# Patient Record
Sex: Female | Born: 1980 | Race: White | Hispanic: No | Marital: Married | Smoking: Current every day smoker
Health system: Southern US, Community
[De-identification: ages and names within clinical notes are randomized; demographics above are authoritative.]

## PROBLEM LIST (undated history)

## (undated) DIAGNOSIS — G43909 Migraine, unspecified, not intractable, without status migrainosus: Secondary | ICD-10-CM

## (undated) DIAGNOSIS — E0500 Thyrotoxicosis with diffuse goiter without thyrotoxic crisis or storm: Secondary | ICD-10-CM

## (undated) DIAGNOSIS — B20 Human immunodeficiency virus [HIV] disease: Secondary | ICD-10-CM

## (undated) DIAGNOSIS — M358 Other specified systemic involvement of connective tissue: Secondary | ICD-10-CM

## (undated) HISTORY — PX: DILATION AND CURETTAGE OF UTERUS: SHX78

## (undated) HISTORY — PX: THYROIDECTOMY: SHX17

---

## 2004-02-26 ENCOUNTER — Emergency Department (HOSPITAL_COMMUNITY): Admission: EM | Admit: 2004-02-26 | Discharge: 2004-02-26 | Payer: Self-pay | Admitting: Emergency Medicine

## 2006-04-24 ENCOUNTER — Encounter: Admission: RE | Admit: 2006-04-24 | Discharge: 2006-05-07 | Payer: Self-pay | Admitting: Family Medicine

## 2007-01-01 ENCOUNTER — Other Ambulatory Visit: Admission: RE | Admit: 2007-01-01 | Discharge: 2007-01-01 | Payer: Self-pay | Admitting: Obstetrics and Gynecology

## 2007-03-25 ENCOUNTER — Ambulatory Visit (HOSPITAL_COMMUNITY): Admission: RE | Admit: 2007-03-25 | Discharge: 2007-03-25 | Payer: Self-pay | Admitting: Obstetrics and Gynecology

## 2007-05-02 ENCOUNTER — Other Ambulatory Visit: Payer: Self-pay

## 2007-05-02 ENCOUNTER — Inpatient Hospital Stay (HOSPITAL_COMMUNITY): Admission: AD | Admit: 2007-05-02 | Discharge: 2007-05-02 | Payer: Self-pay | Admitting: Obstetrics and Gynecology

## 2007-05-02 ENCOUNTER — Other Ambulatory Visit: Payer: Self-pay | Admitting: Emergency Medicine

## 2007-06-27 ENCOUNTER — Inpatient Hospital Stay (HOSPITAL_COMMUNITY): Admission: AD | Admit: 2007-06-27 | Discharge: 2007-06-30 | Payer: Self-pay | Admitting: Obstetrics and Gynecology

## 2007-06-27 ENCOUNTER — Encounter (INDEPENDENT_AMBULATORY_CARE_PROVIDER_SITE_OTHER): Payer: Self-pay | Admitting: Obstetrics and Gynecology

## 2007-09-03 ENCOUNTER — Emergency Department (HOSPITAL_COMMUNITY): Admission: EM | Admit: 2007-09-03 | Discharge: 2007-09-03 | Payer: Self-pay | Admitting: Emergency Medicine

## 2007-11-13 ENCOUNTER — Inpatient Hospital Stay (HOSPITAL_COMMUNITY): Admission: AD | Admit: 2007-11-13 | Discharge: 2007-11-14 | Payer: Self-pay | Admitting: Obstetrics and Gynecology

## 2007-11-16 ENCOUNTER — Inpatient Hospital Stay (HOSPITAL_COMMUNITY): Admission: AD | Admit: 2007-11-16 | Discharge: 2007-11-16 | Payer: Self-pay | Admitting: Family Medicine

## 2007-11-18 ENCOUNTER — Inpatient Hospital Stay (HOSPITAL_COMMUNITY): Admission: AD | Admit: 2007-11-18 | Discharge: 2007-11-18 | Payer: Self-pay | Admitting: Obstetrics and Gynecology

## 2007-11-25 ENCOUNTER — Inpatient Hospital Stay (HOSPITAL_COMMUNITY): Admission: RE | Admit: 2007-11-25 | Discharge: 2007-11-25 | Payer: Self-pay | Admitting: Obstetrics and Gynecology

## 2007-12-02 ENCOUNTER — Inpatient Hospital Stay (HOSPITAL_COMMUNITY): Admission: RE | Admit: 2007-12-02 | Discharge: 2007-12-02 | Payer: Self-pay | Admitting: Obstetrics & Gynecology

## 2009-03-19 ENCOUNTER — Encounter: Admission: RE | Admit: 2009-03-19 | Discharge: 2009-03-19 | Payer: Self-pay | Admitting: Family Medicine

## 2010-12-20 NOTE — Op Note (Signed)
NAMEMARKALA, Desiree Branch            ACCOUNT NO.:  [id number]   MEDICAL RECORD NO.:  [id number]          PATIENT TYPE:  INP   LOCATION:  9304                          FACILITY:  [facility]   PHYSICIAN:  James A. Ashley Royalty, M.D.DATE OF BIRTH:  [birth year]   DATE OF PROCEDURE:  06/27/2007  DATE OF DISCHARGE:                               OPERATIVE REPORT   PREOPERATIVE DIAGNOSES:  1. Intrauterine intrauterine pregnancy at 32 weeks 5 days' gestation.  2. Abruptio placenta.   POSTOPERATIVE DIAGNOSES:  1. Intrauterine intrauterine pregnancy at 32 weeks 5 days' gestation.  2. Abruptio placenta.   PROCEDURE:  Primary low transverse cesarean section.   SURGEON:  Sylvester Harder, MD   ANESTHESIA:  Spinal.   ESTIMATED BLOOD LOSS:  800 mL.   COMPLICATIONS:  None.   PACKS AND DRAINS:  Foley.   FINDINGS:  A 1795 g (3-pound 15-ounce) female, Apgars 5 at 1 minute and 8  at 5 minutes, sent to the newborn nursery.  Arterial cord pH 7.19.   PROCEDURE:  The patient was taken to the operating room and placed in  the sitting position.  After a spinal anesthetic was administered, she  was placed in the dorsal supine and prepped and draped in the usual  manner for abdominal surgery.  Foley catheter was in place.  A  Pfannenstiel incision was made down to level of the fascia, which was  nicked with a knife and incised transversely with Mayo scissors.  The  underlying rectus muscles were separated from the fascia using sharp and  blunt dissection.  The rectus muscles were separated in the midline,  exposing the peritoneum, which was elevated with hemostats and entered  atraumatically with Metzenbaum scissors.  The incision was extended  longitudinally.  The uterus was identified and a bladder flap created by  incising the anterior uterine serosa and sharply and bluntly dissecting  the bladder inferiorly.  It was held in place with a bladder blade.  The  uterus was then entered through a low transverse  incision using sharp  and blunt dissection.  The fluid was noted be clear.  The infant was  delivered from a vertex presentation in an atraumatic manner.  The  infant was suctioned.  The cord was triply clamped, cut and the infant  given immediately to the awaiting pediatrics team.  Arterial cord pH was  obtained from an isolated segment.  Next, regular cord blood was  obtained.  The placenta and membranes were removed in their entirety and  submitted to Pathology for histologic studies.  The uterus was  exteriorized.   The uterus was then closed in 2 running layers with #1 Vicryl; the first  was a running-locking layer; the second was a running, intermittently  locking, and imbricating layer.  Hemostasis was noted.  Uterus, tubes  and ovaries were inspected and found to be normal.  There were returned  to the abdominal cavity.  Copious irrigation was accomplished.  Hemostasis was noted.  The peritoneum was then closed with 3-0 Vicryl in  a running fashion.  The fascia was closed with 0 Vicryl in a running  fashion.  The skin was closed with staples.   The patient tolerated the procedure extremely well and was returned to  the recovery room in good condition.      James A. Ashley Royalty, M.D.  Electronically Signed     JAM/MEDQ  D:  06/27/2007  T:  06/28/2007  Job:  [id number]
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
## 2011-05-02 LAB — WET PREP, GENITAL
Clue Cells Wet Prep HPF POC: NONE SEEN
Trich, Wet Prep: NONE SEEN
Yeast Wet Prep HPF POC: NONE SEEN

## 2011-05-02 LAB — URINALYSIS, ROUTINE W REFLEX MICROSCOPIC
Ketones, ur: NEGATIVE
Nitrite: NEGATIVE
Protein, ur: NEGATIVE
Specific Gravity, Urine: <1.005 — ABNORMAL LOW
Urobilinogen, UA: 0.2
pH: 5.5

## 2011-05-02 LAB — HCG, QUANTITATIVE, PREGNANCY
hCG, Beta Chain, Quant, S: 1362 — ABNORMAL HIGH
hCG, Beta Chain, Quant, S: 399 — ABNORMAL HIGH

## 2011-05-02 LAB — GC/CHLAMYDIA PROBE AMP, GENITAL: Chlamydia, DNA Probe: NEGATIVE

## 2011-05-02 LAB — CBC
Platelets: 214
RDW: 16.8 — ABNORMAL HIGH

## 2011-05-16 LAB — DIC (DISSEMINATED INTRAVASCULAR COAGULATION)PANEL
D-Dimer, Quant: 4.63 — ABNORMAL HIGH
Fibrinogen: 335
INR: 0.9
Prothrombin Time: 12.5
Smear Review: NONE SEEN

## 2011-05-16 LAB — CBC
MCHC: 34.8
MCHC: 35.3
MCV: 92.2
Platelets: 160
Platelets: 185
RBC: 2.97 — ABNORMAL LOW
RDW: 13.1
RDW: 13.1
RDW: 13.2
WBC: 12.8 — ABNORMAL HIGH

## 2011-05-16 LAB — ABO/RH: ABO/RH(D): A POS

## 2011-05-16 LAB — TYPE AND SCREEN: ABO/RH(D): A POS

## 2011-05-18 LAB — CBC
Hemoglobin: 12.7
MCHC: 34.5
Platelets: 209
RBC: 4
RDW: 13.6

## 2011-05-18 LAB — URINALYSIS, ROUTINE W REFLEX MICROSCOPIC
Hgb urine dipstick: NEGATIVE
Ketones, ur: NEGATIVE
Nitrite: NEGATIVE
pH: 7.5

## 2012-01-03 ENCOUNTER — Emergency Department
Admission: EM | Admit: 2012-01-03 | Discharge: 2012-01-03 | Disposition: A | Discharge status: Home / Self Care | Payer: BC Managed Care – PPO | Source: Home / Self Care | Attending: Family Medicine | Admitting: Family Medicine

## 2012-01-03 ENCOUNTER — Emergency Department
Admit: 2012-01-03 | Discharge: 2012-01-03 | Disposition: A | Discharge status: Home / Self Care | Payer: BC Managed Care – PPO

## 2012-01-03 ENCOUNTER — Other Ambulatory Visit: Payer: Self-pay | Admitting: Family Medicine

## 2012-01-03 DIAGNOSIS — J209 Acute bronchitis, unspecified: Secondary | ICD-10-CM

## 2012-01-03 DIAGNOSIS — H6590 Unspecified nonsuppurative otitis media, unspecified ear: Secondary | ICD-10-CM

## 2012-01-03 DIAGNOSIS — H6592 Unspecified nonsuppurative otitis media, left ear: Secondary | ICD-10-CM

## 2012-01-03 HISTORY — DX: Migraine, unspecified, not intractable, without status migrainosus: G43.909

## 2012-01-03 HISTORY — DX: Thyrotoxicosis with diffuse goiter without thyrotoxic crisis or storm: E05.00

## 2012-01-03 MED ORDER — CLARITHROMYCIN 500 MG PO TABS: 500.0000 mg | ORAL_TABLET | Freq: Two times a day (BID) | ORAL | Status: AC

## 2012-01-03 MED ORDER — HYDROCOD POLST-CHLORPHEN POLST 10-8 MG/5ML PO LQCR: 5.0000 mL | Freq: Every evening | ORAL | Status: DC | PRN

## 2012-01-03 MED ORDER — PSEUDOEPHEDRINE-GUAIFENESIN ER 120-1200 MG PO TB12: ORAL_TABLET | ORAL | Status: DC

## 2012-01-03 MED ORDER — PREDNISONE 20 MG PO TABS: 20.0000 mg | ORAL_TABLET | Freq: Two times a day (BID) | ORAL | Status: AC

## 2012-01-03 NOTE — ED Provider Notes (Signed)
History     CSN: [id number]  Arrival date & time 01/03/12  1010   First MD Initiated Contact with Patient 01/03/12 1030      Chief Complaint  Patient presents with  . Cough    productive x 1 week     HPI Comments: Patient complains of approximately 6 day history of a persistent cough, without initial sore throat and sinus congestion.  She has developed mild sinus congestion today, and left ear feels somewhat clogged.  Complains of fatigue but no myalgias.  Cough is now worse at night and generally non-productive during the day.  There has been no pleuritic pain but she does have diffuse chest discomfort because of violent coughing.  She coughs until she gags.  No shortness of breath, but she has begun to wheeze, and has had some relief by using an old albuterol inhaler that had been prescribed to her son.  She has had night sweats.  She has had a positive PPD in the past but never been treated prophylactically.  Past chest X-rays have been negative.  She smokes 1 pack per day.  The history is provided by the patient.    Past Medical History  Diagnosis Date  . Grave's disease   . Migraines     Past Surgical History  Procedure Date  . Cesarean section   . Thyroidectomy   . Dilation and curettage of uterus     Family History  Problem Relation Age of Onset  . Thyroid disease Mother   . Cancer Other     History  Substance Use Topics  . Smoking status: Current Everyday Smoker -- 1.0 packs/day for 20 years  . Smokeless tobacco: Never Used  . Alcohol Use: No    OB History    Grav Para Term Preterm Abortions TAB SAB Ect Mult Living                  Review of Systems No sore throat + cough No pleuritic pain, but has diffuse chest soreness + wheezing + nasal congestion ? post-nasal drainage No sinus pain/pressure No itchy/red eyes ? Earache; left ear feels somewhat clogged No hemoptysis No SOB No fever, + chills/night sweats No nausea No vomiting No abdominal  pain No diarrhea No urinary symptoms No skin rashes + fatigue No myalgias No headache Used OTC meds without relief (Mucinex) Allergies  Adhesive; Ciprofloxacin hcl; Latex; and Sulfa antibiotics  Home Medications   Current Outpatient Rx  Name Route Sig Dispense Refill  . ALPRAZOLAM ER 2 MG PO TB24 Oral Take 2 mg by mouth every morning.    Marland Kitchen DEXTROMETHORPHAN HBR 15 MG/5ML PO SYRP Oral Take 10 mLs by mouth 4 (four) times daily as needed.    . GUAIFENESIN ER 600 MG PO TB12 Oral Take 1,200 mg by mouth 2 (two) times daily.    Marland Kitchen LEVOTHYROXINE SODIUM 137 MCG PO TABS Oral Take 137 mcg by mouth daily.    . ORPHENADRINE CITRATE ER 100 MG PO TB12 Oral Take 100 mg by mouth 2 (two) times daily.    . SUMATRIPTAN SUCCINATE 100 MG PO TABS Oral Take 100 mg by mouth every 2 (two) hours as needed.    Marland Kitchen HYDROCOD POLST-CPM POLST ER 10-8 MG/5ML PO LQCR Oral Take 5 mLs by mouth at bedtime as needed. 115 mL 0  . CLARITHROMYCIN 500 MG PO TABS Oral Take 1 tablet (500 mg total) by mouth 2 (two) times daily. 20 tablet 0  . PREDNISONE 20  MG PO TABS Oral Take 1 tablet (20 mg total) by mouth 2 (two) times daily. 10 tablet 0  . PSEUDOEPHEDRINE-GUAIFENESIN ER 631 [id number] MG PO TB12  Take one tab by mouth twice daily for cough and congestion.  Take with plenty of fluids. 20 each 1    BP 102/70  Pulse 113  Temp(Src) 98.3 F (36.8 C) (Oral)  Resp 18  Ht 5\' 5"  (1.651 m)  Wt 159 lb (72.122 kg)  BMI 26.46 kg/m2  SpO2 100%  LMP 10/07/2011  Physical Exam Nursing notes and Vital Signs reviewed. Appearance:  Patient appears healthy, stated age, and in no acute distress Eyes:  Pupils are equal, round, and reactive to light and accomodation.  Extraocular movement is intact.  Conjunctivae are not inflamed  Ears:  Canals normal.  Right tympanic membrane normal.  Left tympanic membrane has serous effusion  Nose:  Moderately congested turbinates, worse on the left.  No sinus tenderness.   Pharynx:  Normal Neck:  Supple.   Tender shotty posterior nodes are palpated bilaterally  Lungs:   Few faint bibasilar wheezes posteriorly.  Breath sounds are equal.  Heart:  Regular rate and rhythm without murmurs, rubs, or gallops.  Abdomen:  Nontender without masses or hepatosplenomegaly.  Bowel sounds are present.  No CVA or flank tenderness.  Extremities:  No edema.  No calf tenderness Skin:  No rash present.   ED Course  Procedures  none   Labs Reviewed  BORDETELLA PERTUSSIS PCR  MISCELLANEOUS TEST   Dg Chest 2 View  01/03/2012  *RADIOLOGY REPORT*  Clinical Data: Cough for 6 days  CHEST - 2 VIEW  Comparison: None  Findings: The heart size and mediastinal contours are within normal limits.  Both lungs are clear.  The visualized skeletal structures are unremarkable.  IMPRESSION: Negative examination.  Original Report Authenticated By: Rosealee Albee, M.D.     1. Acute bronchitis   2. Left serous otitis media       MDM  ? Pertussis.  Pertussis antibody and culture pending Begin Biaxin, and prednisone burst.  Tussionex at bedtime. May use Afrin nasal spray (or generic oxymetazoline) twice daily for about 5 days.  Also recommend using saline nasal spray several times daily and saline nasal irrigation (AYR is a common brand) Stop all antihistamines for now, and other non-prescription cough/cold preparations. May take Extra Strength Tylenol for chest pain. May continue albuterol inhaler, 2 puffs every 3 to 6 hours as needed. Recommend a Tdap when well.  Follow-up with family doctor if not improving 7 to 10 days.         Lattie Haw, MD 01/03/12 [phone]

## 2012-01-03 NOTE — ED Notes (Signed)
Desiree Branch complains of productive cough with green sputum for 1 week. She is waking with night sweats and chills. She has been taking Mucinex and Robitussin with little relief. She also tried using her sons Albuterol inhaler.

## 2012-01-03 NOTE — Discharge Instructions (Signed)
May use Afrin nasal spray (or generic oxymetazoline) twice daily for about 5 days.  Also recommend using saline nasal spray several times daily and saline nasal irrigation (AYR is a common brand) Stop all antihistamines for now, and other non-prescription cough/cold preparations. May take Extra Strength Tylenol for chest pain. May continue albuterol inhaler, 2 puffs every 3 to 6 hours as needed. Recommend a Tdap when well.  Follow-up with family doctor if not improving 7 to 10 days.

## 2012-01-05 ENCOUNTER — Telehealth: Payer: Self-pay | Admitting: Emergency Medicine

## 2012-01-05 LAB — BORDETELLA PERTUSSIS PCR: B pertussis, DNA: NOT DETECTED

## 2012-01-11 ENCOUNTER — Telehealth: Payer: Self-pay | Admitting: Emergency Medicine

## 2012-01-16 LAB — CULTURE, BORDETELLA PERTUSSIS

## 2014-09-10 ENCOUNTER — Encounter: Payer: Self-pay | Admitting: *Deleted

## 2014-09-10 ENCOUNTER — Emergency Department
Admission: EM | Admit: 2014-09-10 | Discharge: 2014-09-10 | Disposition: A | Discharge status: Home / Self Care | Payer: BLUE CROSS/BLUE SHIELD | Source: Home / Self Care | Attending: Emergency Medicine | Admitting: Emergency Medicine

## 2014-09-10 ENCOUNTER — Emergency Department (INDEPENDENT_AMBULATORY_CARE_PROVIDER_SITE_OTHER): Payer: BLUE CROSS/BLUE SHIELD

## 2014-09-10 DIAGNOSIS — S161XXA Strain of muscle, fascia and tendon at neck level, initial encounter: Secondary | ICD-10-CM

## 2014-09-10 DIAGNOSIS — M5032 Other cervical disc degeneration, mid-cervical region: Secondary | ICD-10-CM

## 2014-09-10 HISTORY — DX: Other specified systemic involvement of connective tissue: M35.8

## 2014-09-10 HISTORY — DX: Thyrotoxicosis with diffuse goiter without thyrotoxic crisis or storm: E05.00

## 2014-09-10 HISTORY — DX: Human immunodeficiency virus (HIV) disease: B20

## 2014-09-10 MED ORDER — MELOXICAM 7.5 MG PO TABS: ORAL_TABLET | ORAL | Status: AC

## 2014-09-10 MED ORDER — METHOCARBAMOL 500 MG PO TABS: 500.0000 mg | ORAL_TABLET | Freq: Two times a day (BID) | ORAL | Status: AC

## 2014-09-10 MED ORDER — KETOROLAC TROMETHAMINE 60 MG/2ML IM SOLN
60.0000 mg | Freq: Once | INTRAMUSCULAR | Status: AC
  Administered 2014-09-10: 60 mg via INTRAMUSCULAR

## 2014-09-10 MED ORDER — HYDROCODONE-ACETAMINOPHEN 5-325 MG PO TABS: 1.0000 | ORAL_TABLET | ORAL | Status: AC | PRN
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
